# Patient Record
Sex: Male | Born: 1935 | Race: White | Hispanic: No | Marital: Married | Smoking: Never smoker
Health system: Southern US, Community
[De-identification: ages and names within clinical notes are randomized; demographics above are authoritative.]

## PROBLEM LIST (undated history)

## (undated) DIAGNOSIS — R609 Edema, unspecified: Secondary | ICD-10-CM

## (undated) DIAGNOSIS — E7800 Pure hypercholesterolemia, unspecified: Secondary | ICD-10-CM

## (undated) DIAGNOSIS — K219 Gastro-esophageal reflux disease without esophagitis: Secondary | ICD-10-CM

## (undated) DIAGNOSIS — I10 Essential (primary) hypertension: Secondary | ICD-10-CM

## (undated) DIAGNOSIS — Z972 Presence of dental prosthetic device (complete) (partial): Secondary | ICD-10-CM

## (undated) HISTORY — PX: COLONOSCOPY: SHX174

## (undated) HISTORY — PX: HERNIA REPAIR: SHX51

---

## 2005-03-28 ENCOUNTER — Ambulatory Visit: Payer: Self-pay | Admitting: Gastroenterology

## 2005-12-30 ENCOUNTER — Ambulatory Visit: Payer: Self-pay | Admitting: Family Medicine

## 2007-12-29 ENCOUNTER — Ambulatory Visit: Payer: Self-pay | Admitting: Family Medicine

## 2008-01-27 ENCOUNTER — Ambulatory Visit: Payer: Self-pay | Admitting: Family Medicine

## 2008-02-27 ENCOUNTER — Ambulatory Visit: Payer: Self-pay | Admitting: Family Medicine

## 2008-03-28 ENCOUNTER — Ambulatory Visit: Payer: Self-pay | Admitting: Family Medicine

## 2009-01-08 ENCOUNTER — Ambulatory Visit: Payer: Self-pay | Admitting: Gastroenterology

## 2009-01-09 ENCOUNTER — Ambulatory Visit: Payer: Self-pay | Admitting: Gastroenterology

## 2013-03-24 ENCOUNTER — Emergency Department: Payer: Self-pay | Admitting: Emergency Medicine

## 2013-03-26 ENCOUNTER — Emergency Department: Payer: Self-pay | Admitting: Neurosurgery

## 2014-05-02 IMAGING — CR RIGHT HAND - COMPLETE 3+ VIEW
1 series · 3 of 3 positions shown · non-contrast
Comparison: None.

CLINICAL DATA: Laceration, crush injury.

EXAM:
RIGHT HAND - COMPLETE 3+ VIEW

[Series 1: pa · 0.17mm/px · 3 of 3 slices shown]
[im 1/3]
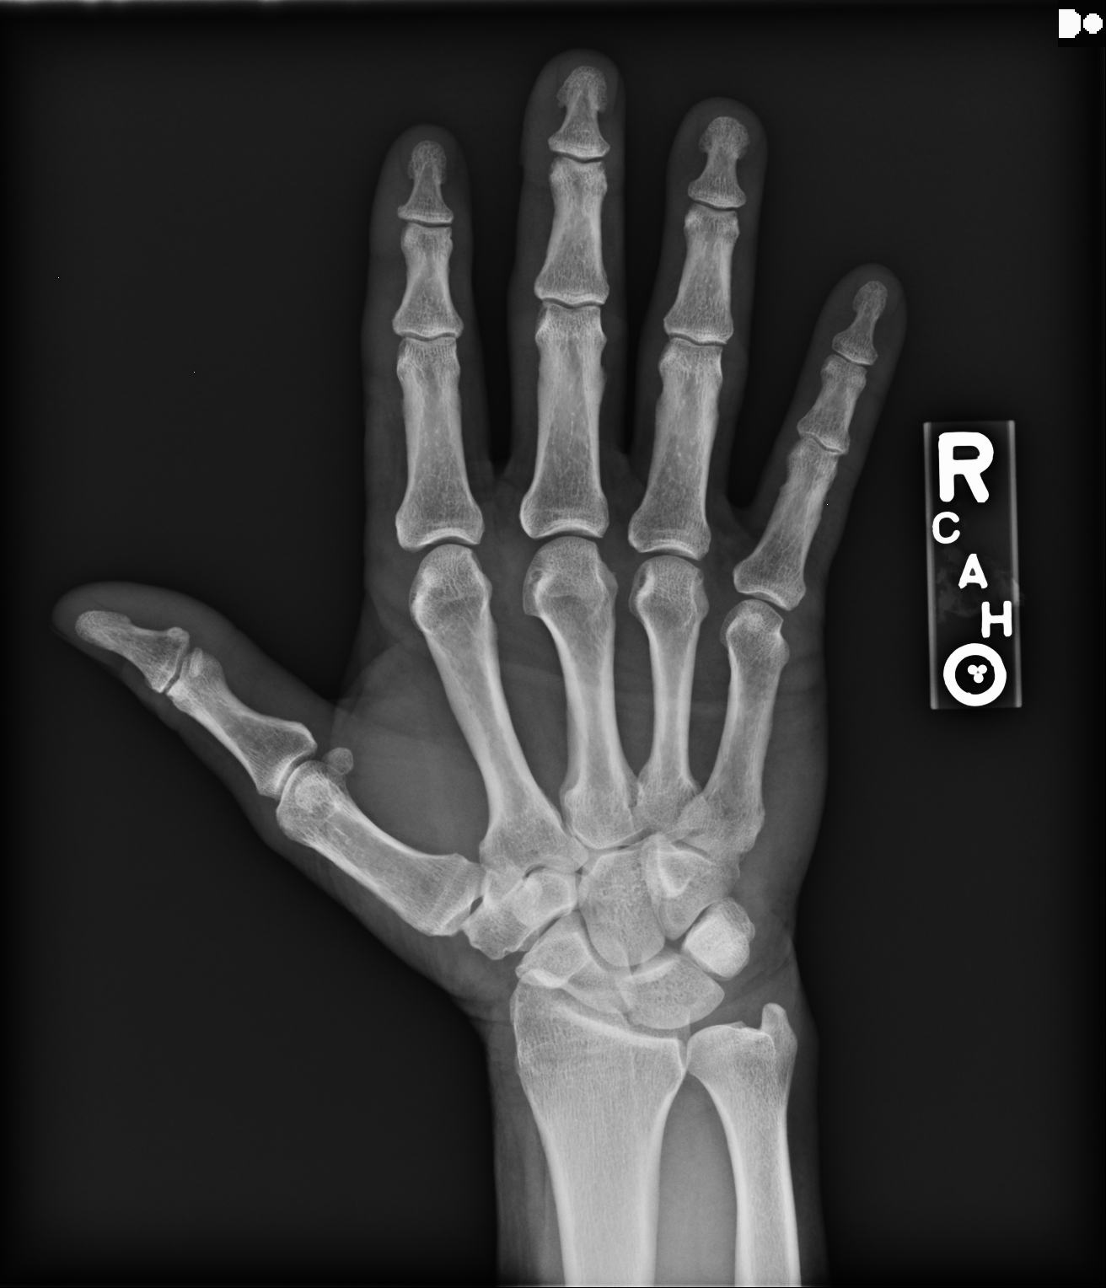
[im 2/3]
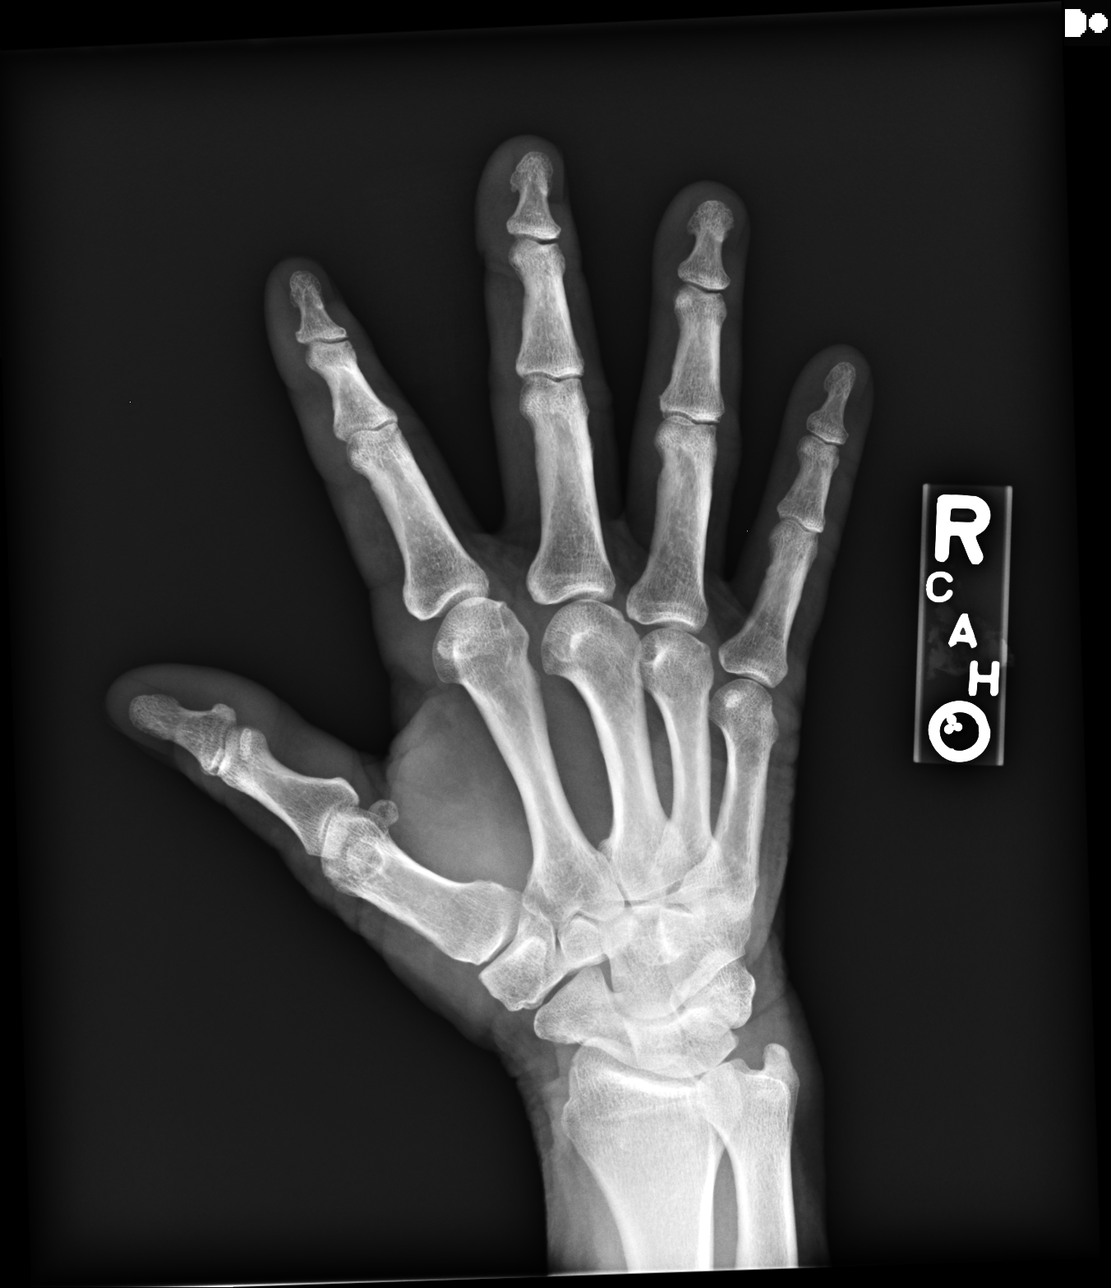
[im 3/3]
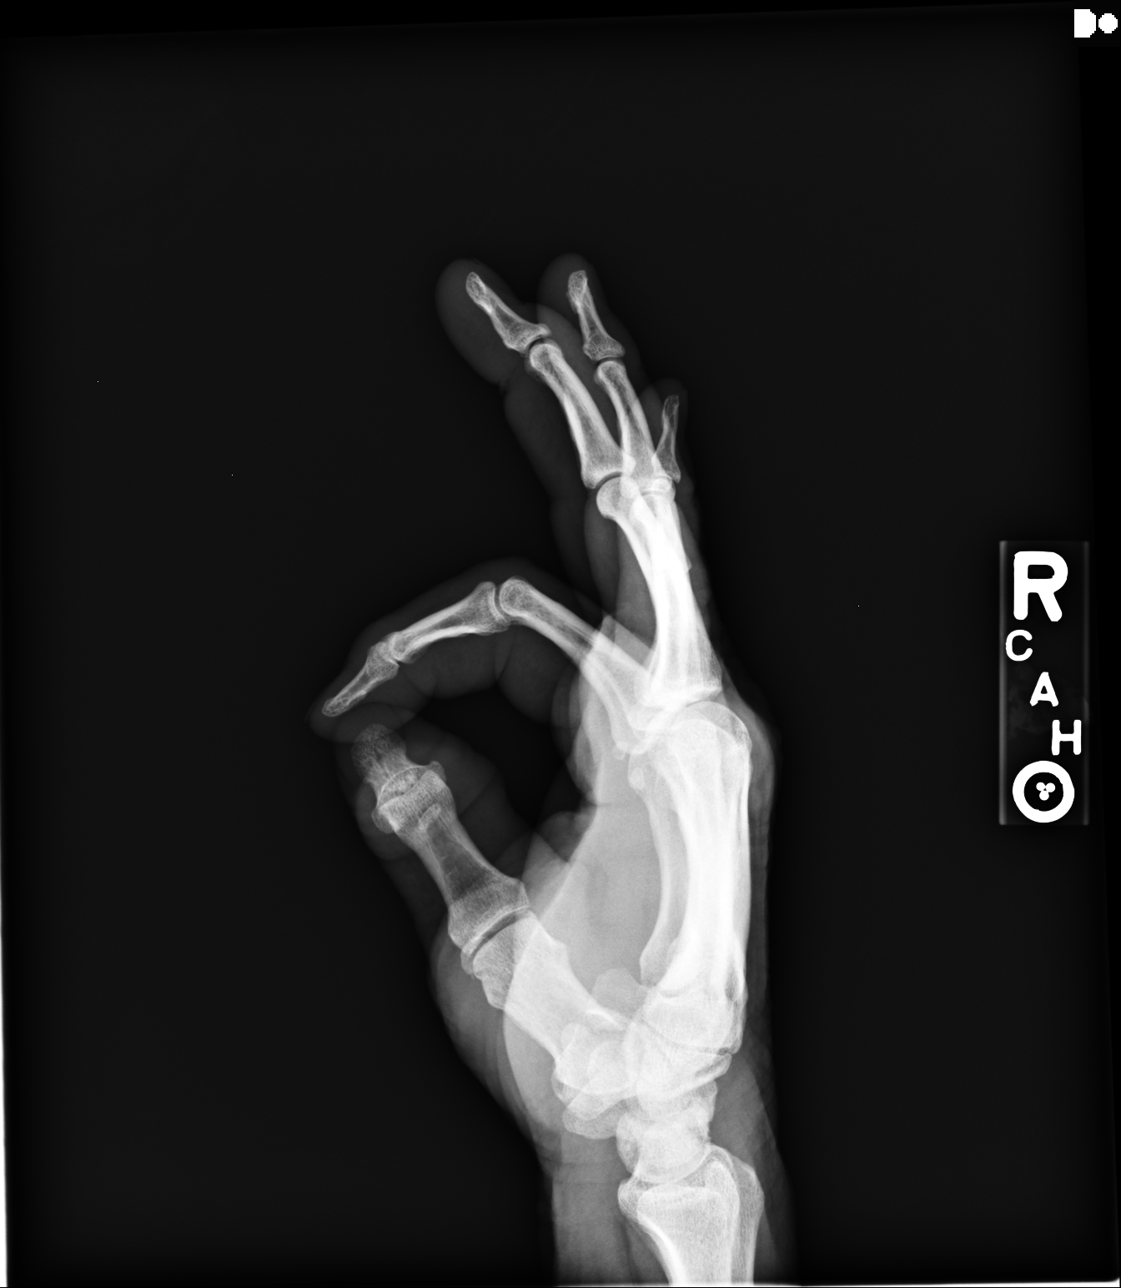

[3 of 3 positions shown; findings below may reference images not displayed]

FINDINGS: There is no evidence of fracture or dislocation. There is no
evidence of arthropathy or other focal bone abnormality. Soft
tissues are unremarkable. No radiodense foreign body or subcutaneous
gas.
IMPRESSION: Negative.

## 2015-08-03 ENCOUNTER — Encounter: Payer: Self-pay | Admitting: *Deleted

## 2015-08-06 NOTE — Discharge Instructions (Signed)

## 2015-08-08 ENCOUNTER — Ambulatory Visit: Payer: Medicare Other | Admitting: Anesthesiology

## 2015-08-08 ENCOUNTER — Encounter
Admission: RE | Disposition: A | Discharge status: Home / Self Care | Payer: Self-pay | Source: Ambulatory Visit | Attending: Ophthalmology

## 2015-08-08 ENCOUNTER — Ambulatory Visit
Admission: RE | Admit: 2015-08-08 | Discharge: 2015-08-08 | Disposition: A | Discharge status: Home / Self Care | Payer: Medicare Other | Source: Ambulatory Visit | Attending: Ophthalmology | Admitting: Ophthalmology

## 2015-08-08 DIAGNOSIS — E7800 Pure hypercholesterolemia, unspecified: Secondary | ICD-10-CM | POA: Insufficient documentation

## 2015-08-08 DIAGNOSIS — I10 Essential (primary) hypertension: Secondary | ICD-10-CM | POA: Insufficient documentation

## 2015-08-08 DIAGNOSIS — M7989 Other specified soft tissue disorders: Secondary | ICD-10-CM | POA: Diagnosis not present

## 2015-08-08 DIAGNOSIS — Z79899 Other long term (current) drug therapy: Secondary | ICD-10-CM | POA: Diagnosis not present

## 2015-08-08 DIAGNOSIS — Z888 Allergy status to other drugs, medicaments and biological substances status: Secondary | ICD-10-CM | POA: Diagnosis not present

## 2015-08-08 DIAGNOSIS — Z7982 Long term (current) use of aspirin: Secondary | ICD-10-CM | POA: Insufficient documentation

## 2015-08-08 DIAGNOSIS — H2511 Age-related nuclear cataract, right eye: Secondary | ICD-10-CM | POA: Diagnosis present

## 2015-08-08 DIAGNOSIS — R7303 Prediabetes: Secondary | ICD-10-CM | POA: Insufficient documentation

## 2015-08-08 HISTORY — DX: Presence of dental prosthetic device (complete) (partial): Z97.2

## 2015-08-08 HISTORY — DX: Gastro-esophageal reflux disease without esophagitis: K21.9

## 2015-08-08 HISTORY — DX: Pure hypercholesterolemia, unspecified: E78.00

## 2015-08-08 HISTORY — DX: Essential (primary) hypertension: I10

## 2015-08-08 HISTORY — PX: CATARACT EXTRACTION W/PHACO: SHX586

## 2015-08-08 SURGERY — PHACOEMULSIFICATION, CATARACT, WITH IOL INSERTION
Anesthesia: Monitor Anesthesia Care | Site: Eye | Laterality: Right | Wound class: Clean

## 2015-08-08 MED ORDER — ARMC OPHTHALMIC DILATING GEL
1.0000 "application " | OPHTHALMIC | Status: DC | PRN
  Administered 2015-08-08 (×2): 1 via OPHTHALMIC

## 2015-08-08 MED ORDER — FENTANYL CITRATE (PF) 100 MCG/2ML IJ SOLN
INTRAMUSCULAR | Status: DC | PRN
  Administered 2015-08-08: 50 ug via INTRAVENOUS

## 2015-08-08 MED ORDER — POVIDONE-IODINE 5 % OP SOLN
1.0000 "application " | OPHTHALMIC | Status: DC | PRN
  Administered 2015-08-08: 1 via OPHTHALMIC

## 2015-08-08 MED ORDER — LIDOCAINE HCL (PF) 4 % IJ SOLN
INTRAOCULAR | Status: DC | PRN
  Administered 2015-08-08: 1 mL via OPHTHALMIC

## 2015-08-08 MED ORDER — CEFUROXIME OPHTHALMIC INJECTION 1 MG/0.1 ML
INJECTION | OPHTHALMIC | Status: DC | PRN
  Administered 2015-08-08: 0.1 mL via INTRACAMERAL

## 2015-08-08 MED ORDER — TIMOLOL MALEATE 0.5 % OP SOLN
OPHTHALMIC | Status: DC | PRN
  Administered 2015-08-08: 1 [drp] via OPHTHALMIC

## 2015-08-08 MED ORDER — EPINEPHRINE HCL 1 MG/ML IJ SOLN
INTRAOCULAR | Status: DC | PRN
  Administered 2015-08-08: 56 mL via OPHTHALMIC

## 2015-08-08 MED ORDER — NA HYALUR & NA CHOND-NA HYALUR 0.4-0.35 ML IO KIT
PACK | INTRAOCULAR | Status: DC | PRN
  Administered 2015-08-08: 1 mL via INTRAOCULAR

## 2015-08-08 MED ORDER — MIDAZOLAM HCL 2 MG/2ML IJ SOLN
INTRAMUSCULAR | Status: DC | PRN
Start: 2015-08-08 — End: 2015-08-08
  Administered 2015-08-08: 2 mg via INTRAVENOUS

## 2015-08-08 MED ORDER — BRIMONIDINE TARTRATE 0.2 % OP SOLN
OPHTHALMIC | Status: DC | PRN
  Administered 2015-08-08: 1 [drp] via OPHTHALMIC

## 2015-08-08 MED ORDER — TETRACAINE HCL 0.5 % OP SOLN
1.0000 [drp] | OPHTHALMIC | Status: DC | PRN
  Administered 2015-08-08: 1 [drp] via OPHTHALMIC

## 2015-08-08 SURGICAL SUPPLY — 26 items
CANNULA ANT/CHMB 27GA (MISCELLANEOUS) ×3 IMPLANT
CARTRIDGE ABBOTT (MISCELLANEOUS) ×3 IMPLANT
GLOVE SURG LX 7.5 STRW (GLOVE) ×2
GLOVE SURG LX STRL 7.5 STRW (GLOVE) ×1 IMPLANT
GLOVE SURG TRIUMPH 8.0 PF LTX (GLOVE) ×3 IMPLANT
GOWN STRL REUS W/ TWL LRG LVL3 (GOWN DISPOSABLE) ×2 IMPLANT
GOWN STRL REUS W/TWL LRG LVL3 (GOWN DISPOSABLE) ×4
LENS IOL TECNIS ITEC 19.0 (Intraocular Lens) ×3 IMPLANT
MARKER SKIN DUAL TIP RULER LAB (MISCELLANEOUS) ×3 IMPLANT
NDL RETROBULBAR .5 NSTRL (NEEDLE) IMPLANT
NEEDLE FILTER BLUNT 18X 1/2SAF (NEEDLE) ×2
NEEDLE FILTER BLUNT 18X1 1/2 (NEEDLE) ×1 IMPLANT
PACK CATARACT BRASINGTON (MISCELLANEOUS) ×3 IMPLANT
PACK EYE AFTER SURG (MISCELLANEOUS) ×3 IMPLANT
PACK OPTHALMIC (MISCELLANEOUS) ×3 IMPLANT
RING MALYGIN 7.0 (MISCELLANEOUS) IMPLANT
SUT ETHILON 10-0 CS-B-6CS-B-6 (SUTURE)
SUT VICRYL  9 0 (SUTURE)
SUT VICRYL 9 0 (SUTURE) IMPLANT
SUTURE EHLN 10-0 CS-B-6CS-B-6 (SUTURE) IMPLANT
SYR 3ML LL SCALE MARK (SYRINGE) ×3 IMPLANT
SYR 5ML LL (SYRINGE) IMPLANT
SYR TB 1ML LUER SLIP (SYRINGE) ×3 IMPLANT
WATER STERILE IRR 250ML POUR (IV SOLUTION) ×3 IMPLANT
WATER STERILE IRR 500ML POUR (IV SOLUTION) IMPLANT
WIPE NON LINTING 3.25X3.25 (MISCELLANEOUS) ×3 IMPLANT

## 2015-08-08 NOTE — Anesthesia Preprocedure Evaluation (Signed)
Anesthesia Evaluation  Patient identified by MRN, date of birth, ID band  Reviewed: Allergy & Precautions, H&P , NPO status , Patient's Chart, lab work & pertinent test results  Airway Mallampati: I  TM Distance: >3 FB Neck ROM: full    Dental  (+) Upper Dentures, Lower Dentures   Pulmonary    Pulmonary exam normal        Cardiovascular hypertension,  Rhythm:regular Rate:Normal     Neuro/Psych    GI/Hepatic GERD  ,  Endo/Other    Renal/GU      Musculoskeletal   Abdominal   Peds  Hematology   Anesthesia Other Findings   Reproductive/Obstetrics                             Anesthesia Physical Anesthesia Plan  ASA: II  Anesthesia Plan: MAC   Post-op Pain Management:    Induction:   Airway Management Planned:   Additional Equipment:   Intra-op Plan:   Post-operative Plan:   Informed Consent: I have reviewed the patients History and Physical, chart, labs and discussed the procedure including the risks, benefits and alternatives for the proposed anesthesia with the patient or authorized representative who has indicated his/her understanding and acceptance.     Plan Discussed with: CRNA  Anesthesia Plan Comments:         Anesthesia Quick Evaluation

## 2015-08-08 NOTE — Op Note (Signed)
LOCATION:  [facility]   PREOPERATIVE DIAGNOSIS:    Nuclear sclerotic cataract right eye. H25.11   POSTOPERATIVE DIAGNOSIS:  Nuclear sclerotic cataract right eye.     PROCEDURE:  Phacoemusification with posterior chamber intraocular lens placement of the right eye   LENS:   Implant Name Type Inv. Item Serial No. Manufacturer Lot No. LRB No. Used  technis aspheric iol pre-loaded     [id number] [facility]   Right 1     PCB00 19.0 D   ULTRASOUND TIME: 12 % of 1 minutes, 12 seconds.  CDE 9.0   SURGEON:  Deirdre Evenerhadwick R. Ellyssa Zagal, MD   ANESTHESIA:  Topical with tetracaine drops and 2% Xylocaine jelly, augmented with 1% preservative-free intracameral lidocaine.    COMPLICATIONS:  None.   DESCRIPTION OF PROCEDURE:  The patient was identified in the holding room and transported to the operating room and placed in the supine position under the operating microscope.  The right eye was identified as the operative eye and it was prepped and draped in the usual sterile ophthalmic fashion.   A 1 millimeter clear-corneal paracentesis was made at the 12:00 position.  0.5 ml of preservative-free 1% lidocaine was injected into the anterior chamber. The anterior chamber was filled with Viscoat viscoelastic.  A 2.4 millimeter keratome was used to make a near-clear corneal incision at the 9:00 position.  A curvilinear capsulorrhexis was made with a cystotome and capsulorrhexis forceps.  Balanced salt solution was used to hydrodissect and hydrodelineate the nucleus.   Phacoemulsification was then used in stop and chop fashion to remove the lens nucleus and epinucleus.  The remaining cortex was then removed using the irrigation and aspiration handpiece. Provisc was then placed into the capsular bag to distend it for lens placement.  A lens was then injected into the capsular bag.  The remaining viscoelastic was aspirated.   Wounds were hydrated with balanced salt solution.  The anterior chamber was  inflated to a physiologic pressure with balanced salt solution.  No wound leaks were noted. Cefuroxime 0.1 ml of a 10mg /ml solution was injected into the anterior chamber for a dose of 1 mg of intracameral antibiotic at the completion of the case.   Timolol and Brimonidine drops were applied to the eye.  The patient was taken to the recovery room in stable condition without complications of anesthesia or surgery.   Tysin Salada 08/08/2015, 9:10 AM

## 2015-08-08 NOTE — H&P (Signed)
  The History and Physical notes are on paper, have been signed, and are to be scanned. The patient remains stable and unchanged from the H&P.   Previous H&P reviewed, patient examined, and there are no changes.  Juan Gibson 08/08/2015 8:41 AM

## 2015-08-08 NOTE — Anesthesia Procedure Notes (Signed)
Procedure Name: MAC Performed by: Talor Desrosiers Pre-anesthesia Checklist: Patient identified, Emergency Drugs available, Suction available, Timeout performed and Patient being monitored Patient Re-evaluated:Patient Re-evaluated prior to inductionOxygen Delivery Method: Nasal cannula Placement Confirmation: positive ETCO2     

## 2015-08-08 NOTE — Anesthesia Postprocedure Evaluation (Signed)
Anesthesia Post Note  Patient: Juan Gibson  Procedure(s) Performed: Procedure(s) (LRB): CATARACT EXTRACTION PHACO AND INTRAOCULAR LENS PLACEMENT (IOC)  right eye (Right)  Patient location during evaluation: PACU Anesthesia Type: MAC Level of consciousness: awake and alert and oriented Pain management: satisfactory to patient Vital Signs Assessment: post-procedure vital signs reviewed and stable Respiratory status: spontaneous breathing, nonlabored ventilation and respiratory function stable Cardiovascular status: blood pressure returned to baseline and stable Postop Assessment: Adequate PO intake and No signs of nausea or vomiting Anesthetic complications: no    Cherly BeachStella, Kalif Kattner J

## 2015-08-08 NOTE — Transfer of Care (Signed)
Immediate Anesthesia Transfer of Care Note  Patient: Juan Gibson  Procedure(s) Performed: Procedure(s) with comments: CATARACT EXTRACTION PHACO AND INTRAOCULAR LENS PLACEMENT (IOC)  right eye (Right) - SHUGARCAINE  Patient Location: PACU  Anesthesia Type: MAC  Level of Consciousness: awake, alert  and patient cooperative  Airway and Oxygen Therapy: Patient Spontanous Breathing and Patient connected to supplemental oxygen  Post-op Assessment: Post-op Vital signs reviewed, Patient's Cardiovascular Status Stable, Respiratory Function Stable, Patent Airway and No signs of Nausea or vomiting  Post-op Vital Signs: Reviewed and stable  Complications: No apparent anesthesia complications

## 2015-08-09 ENCOUNTER — Encounter: Payer: Self-pay | Admitting: Ophthalmology

## 2015-08-30 NOTE — Discharge Instructions (Signed)

## 2015-09-05 ENCOUNTER — Encounter
Admission: RE | Disposition: A | Discharge status: Home / Self Care | Payer: Self-pay | Source: Ambulatory Visit | Attending: Ophthalmology

## 2015-09-05 ENCOUNTER — Ambulatory Visit: Payer: Medicare Other | Admitting: Anesthesiology

## 2015-09-05 ENCOUNTER — Ambulatory Visit
Admission: RE | Admit: 2015-09-05 | Discharge: 2015-09-05 | Disposition: A | Discharge status: Home / Self Care | Payer: Medicare Other | Source: Ambulatory Visit | Attending: Ophthalmology | Admitting: Ophthalmology

## 2015-09-05 DIAGNOSIS — I10 Essential (primary) hypertension: Secondary | ICD-10-CM | POA: Diagnosis not present

## 2015-09-05 DIAGNOSIS — H2512 Age-related nuclear cataract, left eye: Secondary | ICD-10-CM | POA: Diagnosis not present

## 2015-09-05 DIAGNOSIS — K219 Gastro-esophageal reflux disease without esophagitis: Secondary | ICD-10-CM | POA: Diagnosis not present

## 2015-09-05 DIAGNOSIS — Z888 Allergy status to other drugs, medicaments and biological substances status: Secondary | ICD-10-CM | POA: Diagnosis not present

## 2015-09-05 DIAGNOSIS — E7800 Pure hypercholesterolemia, unspecified: Secondary | ICD-10-CM | POA: Diagnosis not present

## 2015-09-05 DIAGNOSIS — R7303 Prediabetes: Secondary | ICD-10-CM | POA: Insufficient documentation

## 2015-09-05 DIAGNOSIS — Z7982 Long term (current) use of aspirin: Secondary | ICD-10-CM | POA: Diagnosis not present

## 2015-09-05 DIAGNOSIS — Z79899 Other long term (current) drug therapy: Secondary | ICD-10-CM | POA: Diagnosis not present

## 2015-09-05 DIAGNOSIS — M7989 Other specified soft tissue disorders: Secondary | ICD-10-CM | POA: Diagnosis not present

## 2015-09-05 DIAGNOSIS — Z9841 Cataract extraction status, right eye: Secondary | ICD-10-CM | POA: Insufficient documentation

## 2015-09-05 HISTORY — DX: Edema, unspecified: R60.9

## 2015-09-05 HISTORY — PX: CATARACT EXTRACTION W/PHACO: SHX586

## 2015-09-05 SURGERY — PHACOEMULSIFICATION, CATARACT, WITH IOL INSERTION
Anesthesia: Monitor Anesthesia Care | Laterality: Left | Wound class: Clean

## 2015-09-05 MED ORDER — BSS IO SOLN
INTRAOCULAR | Status: DC | PRN
  Administered 2015-09-05: 62 mL via OPHTHALMIC

## 2015-09-05 MED ORDER — ARMC OPHTHALMIC DILATING GEL
1.0000 "application " | OPHTHALMIC | Status: DC | PRN
  Administered 2015-09-05 (×2): 1 via OPHTHALMIC

## 2015-09-05 MED ORDER — FENTANYL CITRATE (PF) 100 MCG/2ML IJ SOLN
INTRAMUSCULAR | Status: DC | PRN
  Administered 2015-09-05: 50 ug via INTRAVENOUS

## 2015-09-05 MED ORDER — POVIDONE-IODINE 5 % OP SOLN
1.0000 "application " | OPHTHALMIC | Status: DC | PRN
  Administered 2015-09-05: 1 via OPHTHALMIC

## 2015-09-05 MED ORDER — LIDOCAINE HCL (PF) 4 % IJ SOLN
INTRAOCULAR | Status: DC | PRN
  Administered 2015-09-05: 1 mL via OPHTHALMIC

## 2015-09-05 MED ORDER — TETRACAINE HCL 0.5 % OP SOLN
1.0000 [drp] | OPHTHALMIC | Status: DC | PRN
  Administered 2015-09-05: 1 [drp] via OPHTHALMIC

## 2015-09-05 MED ORDER — TIMOLOL MALEATE 0.5 % OP SOLN
OPHTHALMIC | Status: DC | PRN
  Administered 2015-09-05: 1 [drp] via OPHTHALMIC

## 2015-09-05 MED ORDER — BRIMONIDINE TARTRATE 0.2 % OP SOLN
OPHTHALMIC | Status: DC | PRN
  Administered 2015-09-05: 1 [drp] via OPHTHALMIC

## 2015-09-05 MED ORDER — NA HYALUR & NA CHOND-NA HYALUR 0.4-0.35 ML IO KIT
PACK | INTRAOCULAR | Status: DC | PRN
  Administered 2015-09-05: 1 mL via INTRAOCULAR

## 2015-09-05 MED ORDER — CEFUROXIME OPHTHALMIC INJECTION 1 MG/0.1 ML
INJECTION | OPHTHALMIC | Status: DC | PRN
  Administered 2015-09-05: 0.1 mL via INTRACAMERAL

## 2015-09-05 MED ORDER — MIDAZOLAM HCL 2 MG/2ML IJ SOLN
INTRAMUSCULAR | Status: DC | PRN
  Administered 2015-09-05: 2 mg via INTRAVENOUS

## 2015-09-05 SURGICAL SUPPLY — 21 items
CANNULA ANT/CHMB 27GA (MISCELLANEOUS) ×3 IMPLANT
CARTRIDGE ABBOTT (MISCELLANEOUS) IMPLANT
GLOVE SURG LX 7.5 STRW (GLOVE) ×2
GLOVE SURG LX STRL 7.5 STRW (GLOVE) ×1 IMPLANT
GLOVE SURG TRIUMPH 8.0 PF LTX (GLOVE) ×3 IMPLANT
GOWN STRL REUS W/ TWL LRG LVL3 (GOWN DISPOSABLE) ×2 IMPLANT
GOWN STRL REUS W/TWL LRG LVL3 (GOWN DISPOSABLE) ×4
LENS IOL TECNIS ITEC 20.0 (Intraocular Lens) ×3 IMPLANT
MARKER SKIN DUAL TIP RULER LAB (MISCELLANEOUS) ×3 IMPLANT
NDL RETROBULBAR .5 NSTRL (NEEDLE) IMPLANT
PACK CATARACT BRASINGTON (MISCELLANEOUS) ×3 IMPLANT
PACK EYE AFTER SURG (MISCELLANEOUS) ×3 IMPLANT
PACK OPTHALMIC (MISCELLANEOUS) ×3 IMPLANT
RING MALYGIN 7.0 (MISCELLANEOUS) IMPLANT
SUT ETHILON 10-0 CS-B-6CS-B-6 (SUTURE)
SUT VICRYL  9 0 (SUTURE)
SUT VICRYL 9 0 (SUTURE) IMPLANT
SUTURE EHLN 10-0 CS-B-6CS-B-6 (SUTURE) IMPLANT
SYR TB 1ML LUER SLIP (SYRINGE) ×3 IMPLANT
WATER STERILE IRR 250ML POUR (IV SOLUTION) ×3 IMPLANT
WIPE NON LINTING 3.25X3.25 (MISCELLANEOUS) ×3 IMPLANT

## 2015-09-05 NOTE — H&P (Signed)
  The History and Physical notes are on paper, have been signed, and are to be scanned. The patient remains stable and unchanged from the H&P.   Previous H&P reviewed, patient examined, and there are no changes.  Juan Gibson 09/05/2015 10:04 AM

## 2015-09-05 NOTE — Anesthesia Preprocedure Evaluation (Signed)
Anesthesia Evaluation  Patient identified by MRN, date of birth, ID band Patient awake    Reviewed: Allergy & Precautions, H&P , NPO status , Patient's Chart, lab work & pertinent test results, reviewed documented beta blocker date and time   Airway Mallampati: II  TM Distance: >3 FB Neck ROM: full    Dental  (+) Upper Dentures, Lower Dentures   Pulmonary neg pulmonary ROS,    Pulmonary exam normal breath sounds clear to auscultation       Cardiovascular Exercise Tolerance: Good hypertension,  Rhythm:regular Rate:Normal     Neuro/Psych negative neurological ROS  negative psych ROS   GI/Hepatic Neg liver ROS, GERD  ,  Endo/Other  negative endocrine ROS  Renal/GU negative Renal ROS  negative genitourinary   Musculoskeletal   Abdominal   Peds  Hematology negative hematology ROS (+)   Anesthesia Other Findings   Reproductive/Obstetrics negative OB ROS                             Anesthesia Physical Anesthesia Plan  ASA: II  Anesthesia Plan: MAC   Post-op Pain Management:    Induction:   Airway Management Planned:   Additional Equipment:   Intra-op Plan:   Post-operative Plan:   Informed Consent: I have reviewed the patients History and Physical, chart, labs and discussed the procedure including the risks, benefits and alternatives for the proposed anesthesia with the patient or authorized representative who has indicated his/her understanding and acceptance.   Dental Advisory Given  Plan Discussed with: CRNA  Anesthesia Plan Comments:         Anesthesia Quick Evaluation

## 2015-09-05 NOTE — Anesthesia Procedure Notes (Signed)
Procedure Name: MAC Performed by: Emerson Schreifels Pre-anesthesia Checklist: Patient identified, Emergency Drugs available, Suction available, Timeout performed and Patient being monitored Patient Re-evaluated:Patient Re-evaluated prior to inductionOxygen Delivery Method: Nasal cannula Placement Confirmation: positive ETCO2     

## 2015-09-05 NOTE — Op Note (Signed)
OPERATIVE NOTE  Juan Gibson [id number] 09/05/2015   PREOPERATIVE DIAGNOSIS:  Nuclear sclerotic cataract left eye. H25.12   POSTOPERATIVE DIAGNOSIS:    Nuclear sclerotic cataract left eye.     PROCEDURE:  Phacoemusification with posterior chamber intraocular lens placement of the left eye   LENS:   Implant Name Type Inv. Item Serial No. Manufacturer Lot No. LRB No. Used  LENS IOL DIOP 20.0 - [id number] Intraocular Lens LENS IOL DIOP 20.0 [id number] [facility]   Left 1        ULTRASOUND TIME: 14  % of 0 minutes 11 seconds, CDE 10.1  SURGEON:  Deirdre Evenerhadwick R. Ezekiel Menzer, MD   ANESTHESIA:  Topical with tetracaine drops and 2% Xylocaine jelly, augmented with 1% preservative-free intracameral lidocaine.    COMPLICATIONS:  None.   DESCRIPTION OF PROCEDURE:  The patient was identified in the holding room and transported to the operating room and placed in the supine position under the operating microscope.  The left eye was identified as the operative eye and it was prepped and draped in the usual sterile ophthalmic fashion.   A 1 millimeter clear-corneal paracentesis was made at the 1:30 position.  0.5 ml of preservative-free 1% lidocaine was injected into the anterior chamber.  The anterior chamber was filled with Viscoat viscoelastic.  A 2.4 millimeter keratome was used to make a near-clear corneal incision at the 10:30 position.  .  A curvilinear capsulorrhexis was made with a cystotome and capsulorrhexis forceps.  Balanced salt solution was used to hydrodissect and hydrodelineate the nucleus.   Phacoemulsification was then used in stop and chop fashion to remove the lens nucleus and epinucleus.  The remaining cortex was then removed using the irrigation and aspiration handpiece. Provisc was then placed into the capsular bag to distend it for lens placement.  A lens was then injected into the capsular bag.  The remaining viscoelastic was aspirated.   Wounds were hydrated with balanced salt  solution.  The anterior chamber was inflated to a physiologic pressure with balanced salt solution.  No wound leaks were noted. Cefuroxime 0.1 ml of a 10mg /ml solution was injected into the anterior chamber for a dose of 1 mg of intracameral antibiotic at the completion of the case.   Timolol and Brimonidine drops were applied to the eye.  The patient was taken to the recovery room in stable condition without complications of anesthesia or surgery.  [name] 09/05/2015, 11:30 AM

## 2015-09-05 NOTE — Transfer of Care (Signed)
Immediate Anesthesia Transfer of Care Note  Patient: Juan Gibson  Procedure(s) Performed: Procedure(s): CATARACT EXTRACTION PHACO AND INTRAOCULAR LENS PLACEMENT (IOC) left eye (Left)  Patient Location: PACU  Anesthesia Type: MAC  Level of Consciousness: awake, alert  and patient cooperative  Airway and Oxygen Therapy: Patient Spontanous Breathing and Patient connected to supplemental oxygen  Post-op Assessment: Post-op Vital signs reviewed, Patient's Cardiovascular Status Stable, Respiratory Function Stable, Patent Airway and No signs of Nausea or vomiting  Post-op Vital Signs: Reviewed and stable  Complications: No apparent anesthesia complications

## 2015-09-05 NOTE — Anesthesia Postprocedure Evaluation (Signed)
Anesthesia Post Note  Patient: Juan CallerJessie B Petralia  Procedure(s) Performed: Procedure(s) (LRB): CATARACT EXTRACTION PHACO AND INTRAOCULAR LENS PLACEMENT (IOC) left eye (Left)  Patient location during evaluation: PACU Anesthesia Type: MAC Level of consciousness: awake and alert Pain management: pain level controlled Vital Signs Assessment: post-procedure vital signs reviewed and stable Respiratory status: spontaneous breathing, nonlabored ventilation, respiratory function stable and patient connected to nasal cannula oxygen Cardiovascular status: blood pressure returned to baseline and stable Postop Assessment: no signs of nausea or vomiting Anesthetic complications: no    Scarlette Sliceachel B Myya Meenach

## 2015-09-06 ENCOUNTER — Encounter: Payer: Self-pay | Admitting: Ophthalmology

## 2023-04-29 DEATH — deceased
